# Patient Record
Sex: Female | Born: 1985 | Race: Black or African American | Hispanic: No | Marital: Single | Smoking: Current every day smoker
Health system: Southern US, Community
[De-identification: ages and names within clinical notes are randomized; demographics above are authoritative.]

---

## 2016-05-21 ENCOUNTER — Encounter (HOSPITAL_BASED_OUTPATIENT_CLINIC_OR_DEPARTMENT_OTHER): Payer: Self-pay | Admitting: Emergency Medicine

## 2016-05-21 ENCOUNTER — Emergency Department (HOSPITAL_BASED_OUTPATIENT_CLINIC_OR_DEPARTMENT_OTHER): Payer: Medicaid Other

## 2016-05-21 ENCOUNTER — Emergency Department (HOSPITAL_BASED_OUTPATIENT_CLINIC_OR_DEPARTMENT_OTHER)
Admission: EM | Admit: 2016-05-21 | Discharge: 2016-05-21 | Disposition: A | Discharge status: Home / Self Care | Payer: Medicaid Other | Attending: Emergency Medicine | Admitting: Emergency Medicine

## 2016-05-21 DIAGNOSIS — Y999 Unspecified external cause status: Secondary | ICD-10-CM | POA: Diagnosis not present

## 2016-05-21 DIAGNOSIS — Y9302 Activity, running: Secondary | ICD-10-CM | POA: Insufficient documentation

## 2016-05-21 DIAGNOSIS — F17200 Nicotine dependence, unspecified, uncomplicated: Secondary | ICD-10-CM | POA: Diagnosis not present

## 2016-05-21 DIAGNOSIS — Z23 Encounter for immunization: Secondary | ICD-10-CM | POA: Insufficient documentation

## 2016-05-21 DIAGNOSIS — S91312A Laceration without foreign body, left foot, initial encounter: Secondary | ICD-10-CM | POA: Insufficient documentation

## 2016-05-21 DIAGNOSIS — M79672 Pain in left foot: Secondary | ICD-10-CM

## 2016-05-21 DIAGNOSIS — W458XXA Other foreign body or object entering through skin, initial encounter: Secondary | ICD-10-CM | POA: Insufficient documentation

## 2016-05-21 DIAGNOSIS — Y929 Unspecified place or not applicable: Secondary | ICD-10-CM | POA: Insufficient documentation

## 2016-05-21 DIAGNOSIS — S99922A Unspecified injury of left foot, initial encounter: Secondary | ICD-10-CM | POA: Diagnosis present

## 2016-05-21 MED ORDER — TETANUS-DIPHTH-ACELL PERTUSSIS 5-2.5-18.5 LF-MCG/0.5 IM SUSP
0.5000 mL | Freq: Once | INTRAMUSCULAR | Status: AC
  Administered 2016-05-21: 0.5 mL via INTRAMUSCULAR
  Filled 2016-05-21: qty 0.5

## 2016-05-21 MED ORDER — BACITRACIN ZINC 500 UNIT/GM EX OINT
TOPICAL_OINTMENT | Freq: Two times a day (BID) | CUTANEOUS | Status: DC
  Administered 2016-05-21: 16:00:00 via TOPICAL
  Filled 2016-05-21: qty 28.35

## 2016-05-21 MED ORDER — HYDROCODONE-ACETAMINOPHEN 5-325 MG PO TABS
2.0000 | ORAL_TABLET | Freq: Once | ORAL | Status: AC
  Administered 2016-05-21: 2 via ORAL
  Filled 2016-05-21: qty 2

## 2016-05-21 NOTE — ED Notes (Signed)
Pt assisted into wheelchair and back into lobby.

## 2016-05-21 NOTE — ED Triage Notes (Signed)
Pt reports foot pain to the bottom of her foot since yesterday. Pt states she injured it somehow while trying to stop a dog from attacking someone. [facility] but left before the doctor saw her.

## 2016-05-21 NOTE — Discharge Instructions (Signed)
You can buy bacitracin ointment over-the-counter at your local store. He can apply that twice a day to help with prevention of infection. Keep wound clean and dry. He may soak wound in warm water to help continue with cleaning. Make sure that you get very dry before he put any dressing on it.  Return to the emergency department for any worsening pain, redness, swelling that extends up to her foot, fever or any other worsening or concerning symptoms.  If you do not have a primary care doctor you see regularly, please you the list below. Please call them to arrange for follow-up.    No Primary Care Doctor Call [facility]  [phone] Other agencies that provide inexpensive medical care    [facility]  [phone]    [facility]  [phone]    [facility]  [phone]    [facility]  [phone]    [facility]  [phone]    [facility]  [phone]

## 2016-05-21 NOTE — ED Provider Notes (Signed)
[facility] Provider Note   CSN: [id number] Arrival date & time: 05/21/16  1225     History   Chief Complaint Chief Complaint  Patient presents with  . Foot Pain    HPI Brittany Butler is a 31 y.o. female who presents with left foot pain that began yesterday At 3212 PM. Patient states that she was running after a dog and thinks that she may have twisted it while she was running. Patient states that she ran outside without any shoes on. She does not know if she stepped on anything. She reports that later that evening she started experiencing pain in her left heel and left foot. [facility] night for evaluation of pain but states that she before being seen. She reports that pain has worsened since yesterday. She has been able to ambulate but with pain. She reports her pain is worse with ambulation and bearing weight. She has not taken any other medications for the pain. She denies any numbness, redness to the area. Patient does not know when her last tetanus was.  The history is provided by the patient.    History reviewed. No pertinent past medical history.  There are no active problems to display for this patient.   History reviewed. No pertinent surgical history.  OB History    No data available       Home Medications    Prior to Admission medications   Not on File    Family History No family history on file.  Social History Social History  Substance Use Topics  . Smoking status: Current Every Day Smoker  . Smokeless tobacco: Never Used  . Alcohol use Yes     Allergies   Patient has no known allergies.   Review of Systems Review of Systems  Constitutional: Negative for fever.  Musculoskeletal: Positive for arthralgias.       +Left foot pain  Neurological: Negative for weakness and numbness.     Physical Exam Updated Vital Signs BP 105/70 (BP Location: Right Arm)   Pulse 69   Temp 99.2 F (37.3 C) (Oral)    Resp 18   Ht 5\' 6"  (1.676 m)   Wt 145 lb (65.8 kg)   SpO2 99%   BMI 23.40 kg/m   Physical Exam  Constitutional: She appears well-developed and well-nourished.  HENT:  Head: Normocephalic and atraumatic.  Eyes: Conjunctivae and EOM are normal. Right eye exhibits no discharge. Left eye exhibits no discharge. No scleral icterus.  Pulmonary/Chest: Effort normal.  Musculoskeletal: She exhibits no deformity.  Tenderness palpation to the left medial malleolus and left medial aspect of the foot. No tenderness to lateral aspect of the foot and no tenderness to the dorsal aspect. No metatarsal tenderness. Mild overlying soft tissue swelling. No surrounding ecchymosis, warmth, redness. Medial aspect of left heel has a small circular area with mild abrasion/laceration. Dorsiflexion and plantar flexion of left ankle are intact. Right ankle with no abnormalities.  Neurological: She is alert.  Skin: Skin is warm and dry. Capillary refill takes less than 2 seconds.  Small, circular 1.5 cm laceration to the medial aspect of the left heel. Foreign body visualized. No drainage.  Psychiatric: She has a normal mood and affect. Her speech is normal and behavior is normal.  Nursing note and vitals reviewed.    ED Treatments / Results  Labs (all labs ordered are listed, but only abnormal results are displayed) Labs Reviewed - No data to  display  EKG  EKG Interpretation None       Radiology Dg Foot Complete Left  Result Date: 05/21/2016 CLINICAL DATA:  Left foot pain following injury yesterday. Initial encounter. EXAM: LEFT FOOT - COMPLETE 3+ VIEW COMPARISON:  None. FINDINGS: There is no evidence of fracture or dislocation. There is no evidence of arthropathy or other focal bone abnormality. Soft tissues are unremarkable. IMPRESSION: Negative. Electronically Signed   By: Harmon Pier M.D.   On: 05/21/2016 13:03    Procedures Procedures (including critical care time)  Medications Ordered in  ED Medications  HYDROcodone-acetaminophen (NORCO/VICODIN) 5-325 MG per tablet 2 tablet (2 tablets Oral Given 05/21/16 1401)  Tdap (BOOSTRIX) injection 0.5 mL (0.5 mLs Intramuscular Given 05/21/16 1539)     Initial Impression / Assessment and Plan / ED Course  I have reviewed the triage vital signs and the nursing notes.  Pertinent labs & imaging results that were available during my care of the patient were reviewed by me and considered in my medical decision making (see chart for details).     31 year old female who presents with left foot pain that began yesterday. Consider sprain versus fracture versus foreign body. X-rays ordered at triage.  X-ray reviewed. Negative for any acute fracture or dislocation. Will plan to have patient soak foot and water and Betadine to clean out and we'll reevaluate potential wound on left.  Reevaluation of wound after soaking in Betadine/saline. Patient appears to have a small circular 1.5 cm laceration to the medial aspect of left heel. Wound examined and no evidence of foreign body. Since it has been more than 24 hours since the initial injury occurred, will not repair at this time. Will plan to update patient's tetanus. Bacitracin and sterile dressing applied in the department. Will provide patient with a postop shoe for comfort. Strict return precautions discussed. Patient expresses understanding and agreement to plan.  Final Clinical Impressions(s) / ED Diagnoses   Final diagnoses:  Foot pain, left  Laceration of left foot, initial encounter    New Prescriptions There are no discharge medications for this patient.    Maxwell Caul, PA-C 05/21/16 2320    Vanetta Mulders, MD 05/23/16 [phone]

## 2016-05-21 NOTE — ED Notes (Signed)
Patient transported to X-ray 

## 2017-06-14 ENCOUNTER — Emergency Department (HOSPITAL_BASED_OUTPATIENT_CLINIC_OR_DEPARTMENT_OTHER)
Admission: EM | Admit: 2017-06-14 | Discharge: 2017-06-14 | Disposition: A | Discharge status: Home / Self Care | Payer: Self-pay | Attending: Emergency Medicine | Admitting: Emergency Medicine

## 2017-06-14 ENCOUNTER — Encounter (HOSPITAL_BASED_OUTPATIENT_CLINIC_OR_DEPARTMENT_OTHER): Payer: Self-pay

## 2017-06-14 ENCOUNTER — Other Ambulatory Visit: Payer: Self-pay

## 2017-06-14 ENCOUNTER — Emergency Department (HOSPITAL_BASED_OUTPATIENT_CLINIC_OR_DEPARTMENT_OTHER): Payer: Self-pay

## 2017-06-14 DIAGNOSIS — W19XXXA Unspecified fall, initial encounter: Secondary | ICD-10-CM

## 2017-06-14 DIAGNOSIS — Y929 Unspecified place or not applicable: Secondary | ICD-10-CM | POA: Insufficient documentation

## 2017-06-14 DIAGNOSIS — F17210 Nicotine dependence, cigarettes, uncomplicated: Secondary | ICD-10-CM | POA: Insufficient documentation

## 2017-06-14 DIAGNOSIS — Y33XXXA Other specified events, undetermined intent, initial encounter: Secondary | ICD-10-CM | POA: Insufficient documentation

## 2017-06-14 DIAGNOSIS — Y9389 Activity, other specified: Secondary | ICD-10-CM | POA: Insufficient documentation

## 2017-06-14 DIAGNOSIS — Y998 Other external cause status: Secondary | ICD-10-CM | POA: Insufficient documentation

## 2017-06-14 DIAGNOSIS — S93401A Sprain of unspecified ligament of right ankle, initial encounter: Secondary | ICD-10-CM | POA: Insufficient documentation

## 2017-06-14 MED ORDER — IBUPROFEN 200 MG PO TABS
600.0000 mg | ORAL_TABLET | Freq: Once | ORAL | Status: AC
  Administered 2017-06-14: 600 mg via ORAL
  Filled 2017-06-14: qty 1

## 2017-06-14 NOTE — ED Provider Notes (Signed)
[facility] EMERGENCY DEPARTMENT Provider Note   CSN: [id number] Arrival date & time: 06/14/17  1203     History   Chief Complaint Chief Complaint  Patient presents with  . Fall    HPI Brittany Butler is a 32 y.o. female.  HPI  32 year old female presents with right ankle to knee pain.  Yesterday she was walking up the stairs and tripped in her foot bent plantarly.  She is been having pain at her ankle that shoots up into her knee.  Pain is mostly in her calf.  It was swollen yesterday but she felt like it was improving but today the swelling came back.  She has a hard time ambulating and bearing weight due to the pain.  She has not taken any medicine for the pain.  No numbness or weakness.  History reviewed. No pertinent past medical history.  There are no active problems to display for this patient.   History reviewed. No pertinent surgical history.   OB History   None      Home Medications    Prior to Admission medications   Not on File    Family History No family history on file.  Social History Social History   Tobacco Use  . Smoking status: Current Every Day Smoker    Types: Cigarettes  . Smokeless tobacco: Never Used  Substance Use Topics  . Alcohol use: Yes    Comment: occ  . Drug use: Yes    Types: Marijuana     Allergies   Patient has no known allergies.   Review of Systems Review of Systems  Musculoskeletal: Positive for arthralgias, joint swelling and myalgias.  Skin: Negative for wound.  Neurological: Negative for weakness and numbness.     Physical Exam Updated Vital Signs BP 112/88 (BP Location: Right Arm)   Pulse 72   Temp 99.1 F (37.3 C) (Oral)   Resp 16   Ht 5\' 6"  (1.676 m)   Wt 65.8 kg (145 lb)   SpO2 100%   BMI 23.40 kg/m   Physical Exam  Constitutional: She appears well-developed and well-nourished.  HENT:  Head: Normocephalic and atraumatic.  Eyes: Right eye exhibits no discharge. Left eye exhibits  no discharge.  Cardiovascular: Normal rate and regular rhythm.  Pulses:      Dorsalis pedis pulses are 2+ on the right side.  Pulmonary/Chest: Effort normal.  Musculoskeletal:       Right knee: She exhibits normal range of motion and no swelling. Tenderness found.       Right ankle: She exhibits swelling (mild). She exhibits normal range of motion. Tenderness. Achilles tendon exhibits no pain, no defect and normal Thompson's test results.       Right upper leg: She exhibits no tenderness.       Right lower leg: She exhibits tenderness (calf). She exhibits no bony tenderness.       Right foot: There is no tenderness and no swelling.  Neurological: She is alert.  Skin: Skin is warm and dry.  Nursing note and vitals reviewed.    ED Treatments / Results  Labs (all labs ordered are listed, but only abnormal results are displayed) Labs Reviewed - No data to display  EKG None  Radiology Dg Tibia/fibula Right  Result Date: 06/14/2017 CLINICAL DATA:  Right lower leg pain due to a fall on stairs today. Initial encounter. EXAM: RIGHT TIBIA AND FIBULA - 2 VIEW COMPARISON:  None. FINDINGS: There is no evidence of fracture  or other focal bone lesions. Soft tissues are unremarkable. IMPRESSION: Normal exam. Electronically Signed   By: Drusilla Kannerhomas  Dalessio M.D.   On: 06/14/2017 14:13   Dg Ankle Complete Right  Result Date: 06/14/2017 CLINICAL DATA:  Right ankle pain due to a fall on stairs. Initial encounter. EXAM: RIGHT ANKLE - COMPLETE 3+ VIEW COMPARISON:  None. FINDINGS: There is no evidence of fracture, dislocation, or joint effusion. There is no evidence of arthropathy or other focal bone abnormality. Soft tissues are unremarkable. IMPRESSION: Normal exam. Electronically Signed   By: Drusilla Kannerhomas  Dalessio M.D.   On: 06/14/2017 14:12   Dg Knee Complete 4 Views Right  Result Date: 06/14/2017 CLINICAL DATA:  Right knee pain due to a fall on stairs today. Initial encounter. EXAM: RIGHT KNEE - COMPLETE 4+  VIEW COMPARISON:  None. FINDINGS: No evidence of fracture, dislocation, or joint effusion. No evidence of arthropathy or other focal bone abnormality. Soft tissues are unremarkable. IMPRESSION: Negative exam. Electronically Signed   By: Drusilla Kannerhomas  Dalessio M.D.   On: 06/14/2017 14:13    Procedures Procedures (including critical care time)  Medications Ordered in ED Medications  ibuprofen (ADVIL,MOTRIN) tablet 600 mg (600 mg Oral Given 06/14/17 1448)     Initial Impression / Assessment and Plan / ED Course  I have reviewed the triage vital signs and the nursing notes.  Pertinent labs & imaging results that were available during my care of the patient were reviewed by me and considered in my medical decision making (see chart for details).     X-rays do not show any obvious fracture or dislocations.  She has full range of motion of her extremities, but this is painful.  The swelling is most prominent in her ankle but is still mild.  She is able to ambulate although it is painful.  Seems consistent with a mild ankle sprain.  Placed in Ace wrap and she can have weightbearing as tolerated.  Neurovascular intact.  Final Clinical Impressions(s) / ED Diagnoses   Final diagnoses:  Sprain of right ankle, unspecified ligament, initial encounter    ED Discharge Orders    None       Pricilla LovelessGoldston, Adalina Dopson, MD 06/14/17 1514

## 2017-06-14 NOTE — ED Triage Notes (Signed)
Pt states she fell yesterday-bent right foot backwards-pain today from right knee to ankle-NAD-to triage in w/c-drove self to ED

## 2017-07-24 ENCOUNTER — Other Ambulatory Visit: Payer: Self-pay

## 2017-07-24 ENCOUNTER — Encounter (HOSPITAL_BASED_OUTPATIENT_CLINIC_OR_DEPARTMENT_OTHER): Payer: Self-pay

## 2017-07-24 ENCOUNTER — Emergency Department (HOSPITAL_BASED_OUTPATIENT_CLINIC_OR_DEPARTMENT_OTHER)
Admission: EM | Admit: 2017-07-24 | Discharge: 2017-07-24 | Disposition: A | Discharge status: Home / Self Care | Payer: Self-pay | Attending: Emergency Medicine | Admitting: Emergency Medicine

## 2017-07-24 ENCOUNTER — Emergency Department (HOSPITAL_BASED_OUTPATIENT_CLINIC_OR_DEPARTMENT_OTHER): Payer: Self-pay

## 2017-07-24 DIAGNOSIS — S59911A Unspecified injury of right forearm, initial encounter: Secondary | ICD-10-CM | POA: Insufficient documentation

## 2017-07-24 DIAGNOSIS — F17210 Nicotine dependence, cigarettes, uncomplicated: Secondary | ICD-10-CM | POA: Insufficient documentation

## 2017-07-24 DIAGNOSIS — Y998 Other external cause status: Secondary | ICD-10-CM | POA: Insufficient documentation

## 2017-07-24 DIAGNOSIS — S4991XA Unspecified injury of right shoulder and upper arm, initial encounter: Secondary | ICD-10-CM

## 2017-07-24 DIAGNOSIS — Y929 Unspecified place or not applicable: Secondary | ICD-10-CM | POA: Insufficient documentation

## 2017-07-24 DIAGNOSIS — W51XXXA Accidental striking against or bumped into by another person, initial encounter: Secondary | ICD-10-CM | POA: Insufficient documentation

## 2017-07-24 DIAGNOSIS — Y9389 Activity, other specified: Secondary | ICD-10-CM | POA: Insufficient documentation

## 2017-07-24 MED ORDER — IBUPROFEN 800 MG PO TABS
800.0000 mg | ORAL_TABLET | Freq: Once | ORAL | Status: AC
  Administered 2017-07-24: 800 mg via ORAL
  Filled 2017-07-24: qty 1
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
## 2019-07-19 ENCOUNTER — Encounter (HOSPITAL_BASED_OUTPATIENT_CLINIC_OR_DEPARTMENT_OTHER): Payer: Self-pay | Admitting: Emergency Medicine

## 2019-07-19 ENCOUNTER — Emergency Department (HOSPITAL_BASED_OUTPATIENT_CLINIC_OR_DEPARTMENT_OTHER): Payer: Self-pay

## 2019-07-19 ENCOUNTER — Emergency Department (HOSPITAL_BASED_OUTPATIENT_CLINIC_OR_DEPARTMENT_OTHER)
Admission: EM | Admit: 2019-07-19 | Discharge: 2019-07-19 | Disposition: A | Discharge status: Home / Self Care | Payer: Self-pay | Attending: Emergency Medicine | Admitting: Emergency Medicine

## 2019-07-19 ENCOUNTER — Other Ambulatory Visit: Payer: Self-pay

## 2019-07-19 DIAGNOSIS — F17210 Nicotine dependence, cigarettes, uncomplicated: Secondary | ICD-10-CM | POA: Insufficient documentation

## 2019-07-19 DIAGNOSIS — Y9389 Activity, other specified: Secondary | ICD-10-CM | POA: Insufficient documentation

## 2019-07-19 DIAGNOSIS — S86911A Strain of unspecified muscle(s) and tendon(s) at lower leg level, right leg, initial encounter: Secondary | ICD-10-CM | POA: Insufficient documentation

## 2019-07-19 DIAGNOSIS — W19XXXA Unspecified fall, initial encounter: Secondary | ICD-10-CM | POA: Insufficient documentation

## 2019-07-19 DIAGNOSIS — Y929 Unspecified place or not applicable: Secondary | ICD-10-CM | POA: Insufficient documentation

## 2019-07-19 DIAGNOSIS — Y999 Unspecified external cause status: Secondary | ICD-10-CM | POA: Insufficient documentation

## 2019-07-19 DIAGNOSIS — M25561 Pain in right knee: Secondary | ICD-10-CM

## 2019-07-19 MED ORDER — ACETAMINOPHEN 500 MG PO TABS
1000.0000 mg | ORAL_TABLET | Freq: Once | ORAL | Status: AC
  Administered 2019-07-19: 1000 mg via ORAL
  Filled 2019-07-19: qty 2

## 2019-07-19 NOTE — ED Triage Notes (Signed)
R knee pain x 2 days. No known injury

## 2019-07-19 NOTE — ED Provider Notes (Signed)
[facility] EMERGENCY DEPARTMENT Provider Note   CSN: [id number] Arrival date & time: 07/19/19  1323     History Chief Complaint  Patient presents with  . Knee Pain    Brittany Butler is a 34 y.o. female.  HPI  Patient is a 34 year old female with no pertinent past medical history presented today with right knee pain that began this morning.  States it is achy, severe, worse with movement and touch.  She states that she had no issues 2 days ago but woke up yesterday morning and felt that it was very aching any.  She states that it has not gotten significantly worse or better.  She is taken no medications prior to arrival.  She states she came to the ER today because she felt that it was too painful to walk and she did feel that she can work effectively this way.  She denies any fevers, history of diabetes or steroid use.  Denies any HIV or immunosuppression.  She denies any warmth or swelling of the knee.  She states she has been able to walk but has been limping.     History reviewed. No pertinent past medical history.  There are no problems to display for this patient.   History reviewed. No pertinent surgical history.   OB History   No obstetric history on file.     No family history on file.  Social History   Tobacco Use  . Smoking status: Current Every Day Smoker    Types: Cigarettes  . Smokeless tobacco: Never Used  Substance Use Topics  . Alcohol use: Yes    Comment: occ  . Drug use: Yes    Types: Marijuana    Home Medications Prior to Admission medications   Not on File    Allergies    Patient has no known allergies.  Review of Systems   Review of Systems  Constitutional: Negative for fever.  HENT: Negative for congestion.   Respiratory: Negative for shortness of breath.   Cardiovascular: Negative for chest pain.  Gastrointestinal: Negative for abdominal distention.  Musculoskeletal:       Right knee pain  Neurological: Negative for  dizziness and headaches.    Physical Exam Updated Vital Signs BP 99/67 (BP Location: Right Arm)   Pulse 70   Temp 98.5 F (36.9 C) (Oral)   Resp 16   Ht 5\' 6"  (1.676 m)   Wt 68 kg   SpO2 99%   BMI 24.20 kg/m   Physical Exam Vitals and nursing note reviewed.  Constitutional:      General: She is not in acute distress.    Appearance: Normal appearance. She is not ill-appearing.  HENT:     Head: Normocephalic and atraumatic.     Mouth/Throat:     Mouth: Mucous membranes are dry.  Eyes:     General: No scleral icterus.       Right eye: No discharge.        Left eye: No discharge.     Conjunctiva/sclera: Conjunctivae normal.  Cardiovascular:     Rate and Rhythm: Regular rhythm.     Comments: Pulses 3+ bilateral DP/PT Pulmonary:     Effort: Pulmonary effort is normal.     Breath sounds: No stridor.  Musculoskeletal:     Comments: Right knee w no significant tenderness to palpation of the lateral knee.  Mild tenderness palpation of the right medial knee.  Full range of motion..  Strength somewhat diminished secondary to  discomfort.  Is able to flex and extend fully.  No warmth to touch.  No significant swelling.  Skin:    General: Skin is warm and dry.  Neurological:     Mental Status: She is alert and oriented to person, place, and time. Mental status is at baseline.     Comments: Sensation intact and symmetric bilateral lower extremities.     ED Results / Procedures / Treatments   Labs (all labs ordered are listed, but only abnormal results are displayed) Labs Reviewed - No data to display  EKG None  Radiology DG Knee Complete 4 Views Right  Result Date: 07/19/2019 CLINICAL DATA:  Right knee pain no injury EXAM: RIGHT KNEE - COMPLETE 4+ VIEW COMPARISON:  06/14/2017 FINDINGS: Normal joint spaces. No degenerative change. Negative for fracture or effusion. Benign-appearing cortical thickening proximal tibia unchanged. IMPRESSION: Negative. Electronically Signed   By:  Marlan Palau M.D.   On: 07/19/2019 14:38    Procedures Procedures (including critical care time)  Medications Ordered in ED Medications  acetaminophen (TYLENOL) tablet 1,000 mg (has no administration in time range)    ED Course  I have reviewed the triage vital signs and the nursing notes.  Pertinent labs & imaging results that were available during my care of the patient were reviewed by me and considered in my medical decision making (see chart for details).    MDM Rules/Calculators/A&P                          Patient with atraumatic right knee pain.  Is ambulatory but is has some discomfort.  Has reassuring physical exam with good sensation, strength, movement, full range of motion.  Doubt septic arthritis.  Doubt gout.  She has no history of this.  She has no pertinent past medical history at all.  Denies any systemic symptoms and is well-appearing.  Will provide patient with knee sleeve, crutches and Tylenol ibuprofen recommendations.  I also provided her with a orthopedic referral.  I gave her strict return precautions for symptoms that would be indicative of septic arthritis.  She is understanding of plan is agreeable.  She is resting work note.  I will provide her with a work note to return Saturday.   Final Clinical Impression(s) / ED Diagnoses Final diagnoses:  Acute pain of right knee  Strain of right knee, initial encounter    Rx / DC Orders ED Discharge Orders    None       Gailen Shelter, [place] 07/19/19 1511    Pollyann Savoy, MD 07/20/19 1406

## 2019-07-19 NOTE — Discharge Instructions (Addendum)
Your examination today is most concerning for a muscular injury/tendon injury.   1. Medications: alternate ibuprofen and tylenol for pain control, take all usual home medications as they are prescribed. Please use Tylenol or ibuprofen for pain.  You may use 600 mg ibuprofen every 6 hours or 1000 mg of Tylenol every 6 hours.  You may choose to alternate between the 2.  This would be most effective.  Not to exceed 4 g of Tylenol within 24 hours.  Not to exceed 3200 mg ibuprofen 24 hours.  2. Treatment: rest, ice, elevate and use an ACE wrap or other compressive therapy to decrease swelling. Also drink plenty of fluids and do plenty of gentle stretching and move the affected muscle through its normal range of motion to prevent stiffness. 3. Follow Up: If your symptoms do not improve please follow up with orthopedics/sports medicine or your PCP for discussion of your diagnoses and further evaluation after today's visit; if you do not have a primary care doctor use the resource guide provided to find one; Please return to the ER for worsening symptoms or other concerns.
